# Patient Record
Sex: Female | Born: 1965 | Hispanic: No | Marital: Married | Smoking: Never smoker
Health system: Southern US, Community
[De-identification: ages and names within clinical notes are randomized; demographics above are authoritative.]

---

## 2010-06-08 ENCOUNTER — Other Ambulatory Visit
Admission: RE | Admit: 2010-06-08 | Discharge: 2010-06-08 | Payer: Self-pay | Source: Home / Self Care | Admitting: Family Medicine

## 2020-10-07 ENCOUNTER — Ambulatory Visit (INDEPENDENT_AMBULATORY_CARE_PROVIDER_SITE_OTHER): Payer: 59 | Admitting: Family Medicine

## 2020-10-07 ENCOUNTER — Other Ambulatory Visit: Payer: Self-pay

## 2020-10-07 ENCOUNTER — Other Ambulatory Visit: Payer: Self-pay | Admitting: Family Medicine

## 2020-10-07 ENCOUNTER — Other Ambulatory Visit (HOSPITAL_COMMUNITY)
Admission: RE | Admit: 2020-10-07 | Discharge: 2020-10-07 | Disposition: A | Discharge status: Home / Self Care | Payer: 59 | Source: Ambulatory Visit | Attending: Family Medicine | Admitting: Family Medicine

## 2020-10-07 VITALS — BP 148/119 | HR 85 | Ht 65.0 in | Wt 212.2 lb

## 2020-10-07 DIAGNOSIS — R4589 Other symptoms and signs involving emotional state: Secondary | ICD-10-CM

## 2020-10-07 DIAGNOSIS — I10 Essential (primary) hypertension: Secondary | ICD-10-CM

## 2020-10-07 DIAGNOSIS — Z113 Encounter for screening for infections with a predominantly sexual mode of transmission: Secondary | ICD-10-CM | POA: Diagnosis present

## 2020-10-07 DIAGNOSIS — E669 Obesity, unspecified: Secondary | ICD-10-CM | POA: Diagnosis not present

## 2020-10-07 DIAGNOSIS — Z1231 Encounter for screening mammogram for malignant neoplasm of breast: Secondary | ICD-10-CM

## 2020-10-07 DIAGNOSIS — Z0000 Encounter for general adult medical examination without abnormal findings: Secondary | ICD-10-CM

## 2020-10-07 DIAGNOSIS — Z1159 Encounter for screening for other viral diseases: Secondary | ICD-10-CM | POA: Diagnosis not present

## 2020-10-07 DIAGNOSIS — Z6835 Body mass index (BMI) 35.0-35.9, adult: Secondary | ICD-10-CM | POA: Diagnosis not present

## 2020-10-07 DIAGNOSIS — Z1211 Encounter for screening for malignant neoplasm of colon: Secondary | ICD-10-CM

## 2020-10-07 DIAGNOSIS — Z114 Encounter for screening for human immunodeficiency virus [HIV]: Secondary | ICD-10-CM

## 2020-10-07 DIAGNOSIS — E66812 Obesity, class 2: Secondary | ICD-10-CM

## 2020-10-07 LAB — POCT GLYCOSYLATED HEMOGLOBIN (HGB A1C): Hemoglobin A1C: 5.9 % — AB (ref 4.0–5.6)

## 2020-10-07 MED ORDER — AMLODIPINE BESYLATE 5 MG PO TABS: 5.0000 mg | ORAL_TABLET | Freq: Every day | ORAL | 3 refills | Status: DC

## 2020-10-07 NOTE — Assessment & Plan Note (Addendum)
Patient is behind on health maintenance screening.  Overdue for Pap smear, mammogram, screening colonoscopy.  Given BMI of 35, will also check screening labs.  Hemoglobin A1c elevated at 5.9.  Patient appears motivated to start weight loss journey. -Referral to GI for screening colonoscopy -Follow-up scheduled next month for Pap smear -Screening mammogram ordered -Additional abs: Lipid panel, BMP (would like to start on ACE-I/ARB but creatinine is unknown).  -Continue to encourage lifestyle changes: diet, exercise

## 2020-10-07 NOTE — Patient Instructions (Addendum)
It was wonderful to meet you today.  Today we talked about:  -Today we checked labs to assess your cholesterol panel, Hgb A1c (to screen for diabetes), HIV, Hepatitis B and C, Syphilis, Chlamydia and Gonorrhea  -I ordered for a mamogram  -I started you on amlodipine for high blood pressure, take this once a day. Continue to monitor your blood pressures at home and keep a log. Send me a message with your blood pressures and we can discuss increasing the dose in the next 1-2 weeks. -Below are resources for therapy/counseling  -I placed an order to see a nutritionist. -We will do a Pap smear at your next appointment in June.   Thank you for choosing [facility].   Please call [phone] with any questions about today's appointment.  Please be sure to schedule follow up at the front  desk before you leave today.   Sabino Dick, DO PGY-1 Family Medicine   Outpatient Mental Health Providers (No Insurance required or Self Pay)  [facility]  [phone] [email or web address] [facility]  [address] [place] [address] [place], [place] [phone] Crisis [phone]  [facility]) can see uninsured folks for outpatient therapy [email or web address] [address] [place], [place] [address] [phone]  [facility]    Walk-in Mon-Fri, 8am-3pm [email or web address] Scull [address] [place], [place]  [phone]   [address]  [place] [phone] [facility] for psych med management, there may be a wait- if [facility] is working with clients for OPT, they will coordinate with [facility] for psych  [facility]   Walk-in-Clinic: Monday- Friday 9:00 AM - 4:00 PM [address]   [place], [place] [phone]  [facility] [place] ([employer]) walk in M-F 8am-12pm and  1pm-3pm [place]- [address]     [phone]  [employer] -[address]  Phone: [phone]  [employer] (Mental Health and substance challenges) [address]   [place] [phone]    [email or web address]    [facility]  [place] -[address], [place]     Phone:  [phone] [place]-  [address]  [phone]   [facility]  [address] [place]  [phone] [email or web address]   [facility] ( virtual or zoom therapy) [email or web address]  [address]  [phone]    [facility] [phone]  grief counseling, dementia and caregiver support    [facility] Walk-in MWF 12:30 to 3:00     [address] [place] [address]  [phone]  [email or web address] call to schedule an appointment    Mental Health [facility] Classes ,Support group, Peer support services, [address] [place], [place] [address] [phone]  [email or web address]           National Alliance on Mental Illness (NAMI) [place]- Wellness classes, Support groups        [address], [place], [place] [address] [phone]   [email or web address]   [facility]  (Psycho-social Rehabilitation clubhouse, Individual and group therapy) [address] [place], [place] [address]   [phone]  24- Hour Availability:  [facility] [phone] or [phone] * [facility] [employer] (Domestic Violence, Rape, etc. )2361455334 Vesta Mixer [phone] or [phone] * [facility] [place] [facility]  Services [phone] only) [phone] (after hours) *[facility] Mobile Crisis Unit [phone] *[place] National Suicide Hotline [phone] Len Childs)

## 2020-10-07 NOTE — Progress Notes (Signed)
SUBJECTIVE:   CHIEF COMPLAINT / HPI:   Establish Care Patient presents today to establish care. States that it has been about 4 years since she has seen a physician.  She would like to get caught up on her health maintenance screening and would like screening blood work today.  Her last Pap smear and mammogram were approximately 4 years ago.  Reports no prior abnormalities in her Paps or mammograms.  Elevated Blood Pressures  Also notes that she has had elevated blood pressures at home.  Systolics usually in the 150s with diastolics usually in the 110s.  She is not on any medication for this.  She does note recent stress (more information below).   Depressed Mood Patient states that she believes she is depressed.  Tells me that she is undergoing a lot of stress in her marriage.  She has been married for approximately 30 years but within the last few years discovered that her husband has been unfaithful and has told any lies.  This is because not only her stress but her youngest son, since he is the one who discovered he was being unfaithful.  She feels that because of this increased stress, she has put off caring for herself.  She is finally ready and willing to start on her wellness journey.  She requests resources for therapy and counseling.  No SI.  She feels safe at home.  She does note that her husband has firearms because he hunts, but he has never physically abused to protect her and she generally feels safe though "you never know".  She really feels that she would benefit from having someone to vent/talk to as she navigates through this process.  Obesity Patient notes that she is overweight and would like assistance with nutritional management.  She is also wondering if she should get a Systems analyst for some additional help.  She would like referral to nutritionist for further education and recommendations.  PERTINENT  PMH / PSH: No past medical history on file.  OBJECTIVE:   BP  (!) 148/119 Comment: Right arm  Pulse 85   Ht 5\' 5"  (1.651 m)   Wt 212 lb 4 oz (96.3 kg)   SpO2 98%   BMI 35.32 kg/m    General: Mild emotional distress but not tearful Cardiac: RRR, no murmurs. Respiratory: CTAB, normal effort, No wheezes, rales or rhonchi Extremities: mild 1+ b/l ankle edema Skin: warm and dry, no rashes noted Psych: Appropriate affect   Depression screen PHQ 2/9 10/07/2020  Decreased Interest 1  Down, Depressed, Hopeless 1  PHQ - 2 Score 2  Altered sleeping 2  Tired, decreased energy 2  Change in appetite 2  Feeling bad or failure about yourself  2  Trouble concentrating 3  Moving slowly or fidgety/restless 3  Suicidal thoughts 0  PHQ-9 Score 16  Difficult doing work/chores Somewhat difficult     ASSESSMENT/PLAN:   Healthcare maintenance Patient is behind on health maintenance screening.  Overdue for Pap smear, mammogram, screening colonoscopy.  Given BMI of 35, will also check screening labs.  Hemoglobin A1c elevated at 5.9.  Patient appears motivated to start weight loss journey. -Referral to GI for screening colonoscopy -Follow-up scheduled next month for Pap smear -Screening mammogram ordered -Additional abs: Lipid panel, BMP (would like to start on ACE-I/ARB but creatinine is unknown).  -Continue to encourage lifestyle changes: diet, exercise    Hypertension Patient initially hypertensive upon arrival to 180s systolic over 110s diastolic.  Upon  recheck she improved to 148/119.  Patient states that she monitors her blood pressures at home and they range 150 systolic and 110s diastolic typically.  Given persistently elevated blood pressures, will begin treatment.  Her creatinine is unknown.  At this time we will opt to start with amlodipine even though she has mild edema isolated to her ankles.  She was counseled that this medication could worsen her edema, she is to let me know if this happens.  She will monitor her blood pressures for the next couple  weeks and we could consider additional medication after her labs result/at next follow up. -Start Amlodipine 5mg   -Consider close 1-2 week f/u with Pharmacy team   Depressed mood Patient is undergoing significant stress, mostly related to her marriage.  She is nonsuicidal.  Her PHQ 9 was elevated at 16.  I do feel that she would benefit greatly from therapy/counseling.  She opted to not pursue medication at this time. -Resources for therapy/counseling provided to patient -Mood check and follow-up -Consider addition of antidepressants at follow-up   Obese BMI 35.  Patient is interested in additional resources for nutritional counseling.  She is prediabetic and would benefit greatly from weight loss and exercise. -Referral to nutrition placed today, patient was given number to call Dr. for an appointment -Continue to encourage dietary modifications and exercise     Gerilyn Pilgrim, DO [facility]

## 2020-10-07 NOTE — Assessment & Plan Note (Signed)
Patient is undergoing significant stress, mostly related to her marriage.  She is nonsuicidal.  Her PHQ 9 was elevated at 16.  I do feel that she would benefit greatly from therapy/counseling.  She opted to not pursue medication at this time. -Resources for therapy/counseling provided to patient -Mood check and follow-up -Consider addition of antidepressants at follow-up

## 2020-10-07 NOTE — Assessment & Plan Note (Signed)
BMI 35.  Patient is interested in additional resources for nutritional counseling.  She is prediabetic and would benefit greatly from weight loss and exercise. -Referral to nutrition placed today, patient was given number to call Dr. Gerilyn Pilgrim for an appointment -Continue to encourage dietary modifications and exercise

## 2020-10-07 NOTE — Assessment & Plan Note (Addendum)
Patient initially hypertensive upon arrival to 180s systolic over 110s diastolic.  Upon recheck she improved to 148/119.  Patient states that she monitors her blood pressures at home and they range 150 systolic and 110s diastolic typically.  Given persistently elevated blood pressures, will begin treatment.  Her creatinine is unknown.  At this time we will opt to start with amlodipine even though she has mild edema isolated to her ankles.  She was counseled that this medication could worsen her edema, she is to let me know if this happens.  She will monitor her blood pressures for the next couple weeks and we could consider additional medication after her labs result/at next follow up. -Start Amlodipine 5mg   -Consider close 1-2 week f/u with Pharmacy team

## 2020-10-08 LAB — BASIC METABOLIC PANEL
BUN/Creatinine Ratio: 19 (ref 9–23)
BUN: 16 mg/dL (ref 6–24)
CO2: 24 mmol/L (ref 20–29)
Calcium: 9.3 mg/dL (ref 8.7–10.2)
Chloride: 99 mmol/L (ref 96–106)
Creatinine, Ser: 0.84 mg/dL (ref 0.57–1.00)
Glucose: 84 mg/dL (ref 65–99)
Potassium: 3.7 mmol/L (ref 3.5–5.2)
Sodium: 141 mmol/L (ref 134–144)
eGFR: 83 mL/min/{1.73_m2} (ref >59)

## 2020-10-08 LAB — LIPID PANEL
Chol/HDL Ratio: 4.7 ratio — ABNORMAL HIGH (ref 0.0–4.4)
Cholesterol, Total: 234 mg/dL — ABNORMAL HIGH (ref 100–199)
HDL: 50 mg/dL (ref >39)
LDL Chol Calc (NIH): 168 mg/dL — ABNORMAL HIGH (ref 0–99)
Triglycerides: 89 mg/dL (ref 0–149)
VLDL Cholesterol Cal: 16 mg/dL (ref 5–40)

## 2020-10-08 LAB — HEPATITIS B SURFACE ANTIBODY, QUANTITATIVE: Hepatitis B Surf Ab Quant: 88.7 m[IU]/mL (ref >9.9)

## 2020-10-08 LAB — RPR: RPR Ser Ql: NONREACTIVE

## 2020-10-08 LAB — HEPATITIS C ANTIBODY: Hep C Virus Ab: <0.1 s/co ratio (ref 0.0–0.9)

## 2020-10-08 LAB — HIV ANTIBODY (ROUTINE TESTING W REFLEX): HIV Screen 4th Generation wRfx: NONREACTIVE

## 2020-10-11 LAB — URINE CYTOLOGY ANCILLARY ONLY
Chlamydia: NEGATIVE
Comment: NEGATIVE
Comment: NORMAL
Neisseria Gonorrhea: NEGATIVE

## 2020-10-12 ENCOUNTER — Other Ambulatory Visit: Payer: Self-pay | Admitting: Family Medicine

## 2020-10-12 DIAGNOSIS — I10 Essential (primary) hypertension: Secondary | ICD-10-CM

## 2020-10-12 DIAGNOSIS — E785 Hyperlipidemia, unspecified: Secondary | ICD-10-CM

## 2020-10-12 DIAGNOSIS — E1169 Type 2 diabetes mellitus with other specified complication: Secondary | ICD-10-CM

## 2020-10-12 MED ORDER — ATORVASTATIN CALCIUM 20 MG PO TABS
20.0000 mg | ORAL_TABLET | Freq: Every day | ORAL | 3 refills | Status: DC
Start: 2020-10-12 — End: 2021-11-09

## 2020-10-12 MED ORDER — LOSARTAN POTASSIUM 25 MG PO TABS
25.0000 mg | ORAL_TABLET | Freq: Every day | ORAL | 0 refills | Status: DC
Start: 2020-10-12 — End: 2021-01-10

## 2020-10-12 NOTE — Progress Notes (Signed)
Discussed blood results with patient over the phone using two patient identifiers.  Discussed elevated lipid panel and pre-diabetes.  ASCVD risk increased at 9.8%.  She is amenable to starting atorvastatin 20 mg daily for this.  We further discussed lifestyle modifications including diet and exercise.  She mentioned to me that her blood pressures remain elevated even with starting amlodipine.  Given that her creatinine is normal, will add losartan for more blood pressure control.  Advised patient to call the front office for assistance with scheduling a blood pressure follow-up with pharmacy next week (I attempted to do this but was unsuccessful).

## 2020-11-04 ENCOUNTER — Ambulatory Visit (INDEPENDENT_AMBULATORY_CARE_PROVIDER_SITE_OTHER): Payer: 59 | Admitting: Family Medicine

## 2020-11-04 ENCOUNTER — Other Ambulatory Visit: Payer: Self-pay

## 2020-11-04 VITALS — BP 138/92 | HR 100 | Temp 98.2°F | Wt 212.0 lb

## 2020-11-04 DIAGNOSIS — E669 Obesity, unspecified: Secondary | ICD-10-CM | POA: Diagnosis not present

## 2020-11-04 DIAGNOSIS — I10 Essential (primary) hypertension: Secondary | ICD-10-CM | POA: Diagnosis not present

## 2020-11-04 DIAGNOSIS — R4589 Other symptoms and signs involving emotional state: Secondary | ICD-10-CM | POA: Diagnosis not present

## 2020-11-04 DIAGNOSIS — Z6835 Body mass index (BMI) 35.0-35.9, adult: Secondary | ICD-10-CM

## 2020-11-04 MED ORDER — AMLODIPINE BESYLATE 10 MG PO TABS: 10.0000 mg | ORAL_TABLET | Freq: Every day | ORAL | 3 refills | Status: DC

## 2020-11-04 MED ORDER — SERTRALINE HCL 50 MG PO TABS: 50.0000 mg | ORAL_TABLET | Freq: Every day | ORAL | 0 refills | Status: DC

## 2020-11-04 NOTE — Progress Notes (Signed)
SUBJECTIVE:   CHIEF COMPLAINT / HPI:   Hypertension Patient presents today for follow up on her blood pressures. She brings a log with her today. Log shows BP's consistently in 130-140 systolic with generally 90's diastolic. She is currently taking 5 mg Amlodipine and 25 mg Losartan. Tolerating these medications well. States that when she went in for her recent cataract surgery she was told her blood pressure was really high, believes 100's diastolic. She is working on weight loss, would like contact information for Dr. Gerilyn Pilgrim again.  Depressed Mood Patient also following up for depressed mood. States that she has not yet scheduled an appointment for therapy/counseling due to her eye surgeries. Now that she is healing and able to see better, she will look into scheduling appointment. She would like to have someone to talk to regarding her stressors and past traumas. Most of her depressed mood and stress are a result of her marriage. She is working on figuring out financial matters prior to proceeding with divorce. She denies suicidal ideations or thoughts of self-harm.   Urinary Urgency Briefly she discussed with me about problems with urinary urgency. States that when she has to go to the bathroom she has to go immediately, unable to hold her urine in.   Additional concerns patient had that were unable to be discussed further today: -Pain in fingers. States typically in the mornings and feels arthritic.   PERTINENT  PMH / PSH: No past medical history on file.   OBJECTIVE:   BP (!) 138/92   Pulse 100   Temp 98.2 F (36.8 C) (Oral)   Wt 212 lb (96.2 kg)   SpO2 97%   BMI 35.28 kg/m    General: NAD, wearing sunglasses, anxious appearing, able to participate in exam Cardiac: RRR, no murmurs. Respiratory: CTAB, normal effort, No wheezes, rales or rhonchi Neuro: speaking clearly, answering questions appropriately, able to follow commands  Psych: Anxious and depressed affect    Depression screen [facility] 2/9 11/04/2020 11/04/2020 10/07/2020  Decreased Interest 2 2 1   Down, Depressed, Hopeless 2 2 1   PHQ - 2 Score 4 4 2   Altered sleeping 2 2 2   Tired, decreased energy 2 2 2   Change in appetite 2 2 2   Feeling bad or failure about yourself  2 2 2   Trouble concentrating 2 2 3   Moving slowly or fidgety/restless 2 2 3   Suicidal thoughts 0 2 0  PHQ-9 Score 16 18 16   Difficult doing work/chores Somewhat difficult Very difficult Somewhat difficult     ASSESSMENT/PLAN:   Hypertension Still hypertensive today, 138/92. Reviewed patients BP log which also demonstrates consistent hypertension even at home. -Increase Amlodipine to 10 mg daily  -Continue Losartan 25 mg daily -BMP today  Obese Still interested in meeting with Dr. for nutritional help for weight loss. Patient appears motivated for weight loss. Congratulated her efforts on trying to lose weight.  -Dr. contact information provided to patient again today.  Depressed mood PHQ-9 today similar to previous. At first patient had responded with a "2" to question 9 about SI. When I discussed this with her she stated that was done in error and she denied any thoughts of wanting to harm or kill herself. She still has the resources provided to her at the last appointment for therapy/counseling. I encouraged her to go as I feel she would benefit greatly. Additionally, we discussed starting medications to help with her depression as well. She was interested. Advised on common  side effects of SSRI's. She has no history of manic symptoms or personal or family history of bipolar disorder. -Start Sertraline 50 mg daily  -Patient to schedule appointment for therapy/counseling  -Mood check in 1 week at follow up   Will need at Follow-Up -Pap Smear -offer Differin gel for areas of hyperpigmentation on face -BP recheck  -Mood check  Sabino Dick, DO [facility]

## 2020-11-04 NOTE — Assessment & Plan Note (Signed)
Still interested in meeting with Dr. Gerilyn Pilgrim for nutritional help for weight loss. Patient appears motivated for weight loss. Congratulated her efforts on trying to lose weight.  -Dr. Gerilyn Pilgrim contact information provided to patient again today.

## 2020-11-04 NOTE — Assessment & Plan Note (Signed)
Still hypertensive today, 138/92. Reviewed patients BP log which also demonstrates consistent hypertension even at home. -Increase Amlodipine to 10 mg daily  -Continue Losartan 25 mg daily -BMP today

## 2020-11-04 NOTE — Assessment & Plan Note (Signed)
PHQ-9 today similar to previous. At first patient had responded with a "2" to question 9 about SI. When I discussed this with her she stated that was done in error and she denied any thoughts of wanting to harm or kill herself. She still has the resources provided to her at the last appointment for therapy/counseling. I encouraged her to go as I feel she would benefit greatly. Additionally, we discussed starting medications to help with her depression as well. She was interested. Advised on common side effects of SSRI's. She has no history of manic symptoms or personal or family history of bipolar disorder. -Start Sertraline 50 mg daily  -Patient to schedule appointment for therapy/counseling  -Mood check in 1 week at follow up

## 2020-11-04 NOTE — Patient Instructions (Addendum)
It was wonderful to see you today.  Please bring ALL of your medications with you to every visit.   Today we talked about:  I am starting you a medication called Sertraline for depression.   Increase your Amlodipine 10 mg once a day.  We are checking some blood work today to check on your electrolyte and kidney function.   You have an appointment with me on 6/10 at 945 for a Pap smear.  You can do kegel exercises below to help strengthen your pelvic muscles, this will hopefully help with your urinary urgency.  Thank you for choosing [facility].   Please call [phone] with any questions about today's appointment.  Please be sure to schedule follow up at the front  desk before you leave today.   Sabino Dick, DO PGY-1 Family Medicine         Kegel Exercises  Kegel exercises can help strengthen your pelvic floor muscles. The pelvic floor is a group of muscles that support your rectum, small intestine, and bladder. In females, pelvic floor muscles also help support the womb (uterus). These muscles help you control the flow of urine and stool. Kegel exercises are painless and simple, and they do not require any equipment. Your provider may suggest Kegel exercises to:  Improve bladder and bowel control.  Improve sexual response.  Improve weak pelvic floor muscles after surgery to remove the uterus (hysterectomy) or pregnancy (females).  Improve weak pelvic floor muscles after prostate gland removal or surgery (males). Kegel exercises involve squeezing your pelvic floor muscles, which are the same muscles you squeeze when you try to stop the flow of urine or keep from passing gas. The exercises can be done while sitting, standing, or lying down, but it is best to vary your position. Exercises How to do Kegel exercises: 1. Squeeze your pelvic floor muscles tight. You should feel a tight lift in your rectal area. If you are a female, you should also  feel a tightness in your vaginal area. Keep your stomach, buttocks, and legs relaxed. 2. Hold the muscles tight for up to 10 seconds. 3. Breathe normally. 4. Relax your muscles. 5. Repeat as told by your health care provider. Repeat this exercise daily as told by your health care provider. Continue to do this exercise for at least 4-6 weeks, or for as long as told by your health care provider. You may be referred to a physical therapist who can help you learn more about how to do Kegel exercises. Depending on your condition, your health care provider may recommend:  Varying how long you squeeze your muscles.  Doing several sets of exercises every day.  Doing exercises for several weeks.  Making Kegel exercises a part of your regular exercise routine. This information is not intended to replace advice given to you by your health care provider. Make sure you discuss any questions you have with your health care provider. Document Revised: 09/26/2019 Document Reviewed: 01/09/2018 Elsevier Patient Education  2021 [employer].

## 2020-11-05 LAB — BASIC METABOLIC PANEL
BUN/Creatinine Ratio: 18 (ref 9–23)
BUN: 14 mg/dL (ref 6–24)
CO2: 25 mmol/L (ref 20–29)
Calcium: 9.4 mg/dL (ref 8.7–10.2)
Chloride: 100 mmol/L (ref 96–106)
Creatinine, Ser: 0.78 mg/dL (ref 0.57–1.00)
Glucose: 91 mg/dL (ref 65–99)
Potassium: 4.2 mmol/L (ref 3.5–5.2)
Sodium: 139 mmol/L (ref 134–144)
eGFR: 91 mL/min/{1.73_m2} (ref >59)

## 2020-11-08 ENCOUNTER — Encounter: Payer: Self-pay | Admitting: Family Medicine

## 2020-11-11 NOTE — Progress Notes (Addendum)
SUBJECTIVE:   CHIEF COMPLAINT / HPI:   Depression F/U Started on Sertraline 50 mg at last visit. Taking daily and tolerating well without side effects. Feels that her mood is improved and she is sleeping better. She still has not called to schedule an appointment with a therapist as she has been busy with follow ups from her recent eye surgery. She hopes to do this within the next week. Tolerating current dose well, prefers to stay at current dose.    Encounter for Pap smear Needs Pap smear for cervical cancer screening. Also would like screening for STI's. No abnormal vaginal discharge.   Hypertension F/U BP today 122/82. On Losartan 25 mg and Amlodipine was increased from 5 mg to 10 mg at recent visit. BMP last visit which was normal. Patient has been taking medication daily without adverse effects. Also working on exercising more. Long-term goal is for weight loss and to come off medications.   Contact Dermatitis Started after doing yard work 1 week ago.  On both legs, feels that it is spreading.  She has been using over-the-counter poison ivy cream.  States that in the past when she has had this happen she has required antibiotics.  PERTINENT  PMH / PSH: HTN, Depression   OBJECTIVE:   BP 122/82   Pulse 64   Ht 5\' 5"  (1.651 m)   Wt 210 lb (95.3 kg)   LMP 11/13/2015   SpO2 98%   BMI 34.95 kg/m    General: NAD, pleasant, able to participate in exam Respiratory: Breathing comfortably on room air no respiratory distress Extremities: no edema or cyanosis. Skin: erythematous, raised linear areas of dermatitis on b/l legs (pictures below) GU: Normal appearing external female genitalia, normal vaginal epithelium, scant clear discharge at vaginal vault. Normal appearing cervix.  Psych: Normal affect and mood  GU Exam chaperoned by 01/13/2016, CMA     Depression screen [facility] 2/9 11/12/2020 11/04/2020 11/04/2020  Decreased Interest 2 2 2   Down, Depressed, Hopeless 1 2 2   PHQ  - 2 Score 3 4 4   Altered sleeping 1 2 2   Tired, decreased energy 2 2 2   Change in appetite 1 2 2   Feeling bad or failure about yourself  1 2 2   Trouble concentrating 2 2 2   Moving slowly or fidgety/restless 1 2 2   Suicidal thoughts 0 0 2  PHQ-9 Score 11 16 18   Difficult doing work/chores - Somewhat difficult Very difficult     ASSESSMENT/PLAN:   Pap smear for cervical cancer screening Pap smear with HPV cotesting performed today.  GU exam without any abnormalities. -Follow-up results; if normal next screening in 5 years  Routine screening for STI (sexually transmitted infection) Patient elected to undergo screening for sexually transmitted infections. -Follow-up GC/chlamydia, trichomonas -Treat as appropriate  Hypertension Blood pressure is much improved today.  Patient is on losartan 25 mg and amlodipine 10 mg.  She is tolerating both medications well without any adverse effects.  She is working towards weight loss and exercise.  Anticipate being able to decrease her medications in the future as she loses weight. -Continue current medication regimen  Depressed mood Taking sertraline 50 mg daily without adverse effects.  Reports her mood has improved and she is also sleeping better.  PHQ-9 slightly improved from 16 to 11. patient requests to continue at current dose at this time.  There is room to increase in the future if necessary.  No SI.  Patient will call to arrange  therapy/counseling.  Contact dermatitis To both legs.  Has been using over-the-counter poison ivy cream without relief.  On exam, areas are linear and consistent with poison ivy.  Do not feel that antibiotics are necessary at this time.  Will give prescription for topical steroids. -Triamcinolone 0.1% cream twice daily to affected areas -Return if worsens or no improvement -Advised patient to avoid contact with the area as possible    Sabino Dick, DO [facility]

## 2020-11-12 ENCOUNTER — Other Ambulatory Visit: Payer: Self-pay | Admitting: Family Medicine

## 2020-11-12 ENCOUNTER — Ambulatory Visit (INDEPENDENT_AMBULATORY_CARE_PROVIDER_SITE_OTHER): Payer: 59 | Admitting: Family Medicine

## 2020-11-12 ENCOUNTER — Other Ambulatory Visit (HOSPITAL_COMMUNITY)
Admission: RE | Admit: 2020-11-12 | Discharge: 2020-11-12 | Disposition: A | Discharge status: Home / Self Care | Payer: 59 | Source: Ambulatory Visit | Attending: Family Medicine | Admitting: Family Medicine

## 2020-11-12 ENCOUNTER — Other Ambulatory Visit: Payer: Self-pay

## 2020-11-12 ENCOUNTER — Encounter: Payer: Self-pay | Admitting: Family Medicine

## 2020-11-12 VITALS — BP 122/82 | HR 64 | Ht 65.0 in | Wt 210.0 lb

## 2020-11-12 DIAGNOSIS — Z113 Encounter for screening for infections with a predominantly sexual mode of transmission: Secondary | ICD-10-CM | POA: Diagnosis not present

## 2020-11-12 DIAGNOSIS — I10 Essential (primary) hypertension: Secondary | ICD-10-CM

## 2020-11-12 DIAGNOSIS — L259 Unspecified contact dermatitis, unspecified cause: Secondary | ICD-10-CM | POA: Insufficient documentation

## 2020-11-12 DIAGNOSIS — L255 Unspecified contact dermatitis due to plants, except food: Secondary | ICD-10-CM

## 2020-11-12 DIAGNOSIS — L237 Allergic contact dermatitis due to plants, except food: Secondary | ICD-10-CM

## 2020-11-12 DIAGNOSIS — Z124 Encounter for screening for malignant neoplasm of cervix: Secondary | ICD-10-CM

## 2020-11-12 DIAGNOSIS — R4589 Other symptoms and signs involving emotional state: Secondary | ICD-10-CM

## 2020-11-12 MED ORDER — TRIAMCINOLONE ACETONIDE 0.1 % EX CREA: 1.0000 "application " | TOPICAL_CREAM | Freq: Two times a day (BID) | CUTANEOUS | 0 refills | Status: DC

## 2020-11-12 MED ORDER — SERTRALINE HCL 50 MG PO TABS: 50.0000 mg | ORAL_TABLET | Freq: Every day | ORAL | 3 refills | Status: AC

## 2020-11-12 NOTE — Assessment & Plan Note (Signed)
Patient elected to undergo screening for sexually transmitted infections. -Follow-up GC/chlamydia, trichomonas -Treat as appropriate

## 2020-11-12 NOTE — Assessment & Plan Note (Addendum)
Taking sertraline 50 mg daily without adverse effects.  Reports her mood has improved and she is also sleeping better.  PHQ-9 slightly improved from 16 to 11. patient requests to continue at current dose at this time.  There is room to increase in the future if necessary.  No SI.  Patient will call to arrange therapy/counseling.

## 2020-11-12 NOTE — Assessment & Plan Note (Signed)
To both legs.  Has been using over-the-counter poison ivy cream without relief.  On exam, areas are linear and consistent with poison ivy.  Do not feel that antibiotics are necessary at this time.  Will give prescription for topical steroids. -Triamcinolone 0.1% cream twice daily to affected areas -Return if worsens or no improvement -Advised patient to avoid contact with the area as possible

## 2020-11-12 NOTE — Assessment & Plan Note (Signed)
Pap smear with HPV cotesting performed today.  GU exam without any abnormalities. -Follow-up results; if normal next screening in 5 years

## 2020-11-12 NOTE — Assessment & Plan Note (Addendum)
Blood pressure is much improved today.  Patient is on losartan 25 mg and amlodipine 10 mg.  She is tolerating both medications well without any adverse effects.  She is working towards weight loss and exercise.  Anticipate being able to decrease her medications in the future as she loses weight. -Continue current medication regimen

## 2020-11-12 NOTE — Patient Instructions (Signed)
It was wonderful to see you today.  Please bring ALL of your medications with you to every visit.   Today we talked about:  Your blood pressure. It is much better! Continue to take your medications daily. With time as you work towards weight loss and more exercise we may be able to stop some of these medications. Try to also limit the salt in your diet.  I'm glad that you are feeling better and sleeping better. Continue to take Sertraline daily for your depression. We will stay at the current dose.  We did a Pap smear today and screening for sexually transmitted infections. I will let you know the results of this. If all is normal, your next screen would be in 5 years.   I have prescribed a steroid cream for you to use for your poison ivy. Use it twice a day. It should start to clear up. Try to avoid scratching or touching the area as it will spread.  Thank you for choosing [facility].   Please call [phone] with any questions about today's appointment.   Sabino Dick, DO PGY-1 Family Medicine

## 2020-11-15 LAB — CYTOLOGY - PAP
Adequacy: ABSENT
Comment: NEGATIVE
Diagnosis: NEGATIVE
High risk HPV: NEGATIVE

## 2020-11-15 LAB — CERVICOVAGINAL ANCILLARY ONLY
Chlamydia: NEGATIVE
Comment: NEGATIVE
Comment: NEGATIVE
Comment: NORMAL
Neisseria Gonorrhea: NEGATIVE
Trichomonas: NEGATIVE

## 2020-11-16 ENCOUNTER — Other Ambulatory Visit: Payer: Self-pay | Admitting: Family Medicine

## 2020-11-16 DIAGNOSIS — L255 Unspecified contact dermatitis due to plants, except food: Secondary | ICD-10-CM

## 2020-11-16 MED ORDER — TRIAMCINOLONE ACETONIDE 0.5 % EX OINT: 1.0000 "application " | TOPICAL_OINTMENT | Freq: Two times a day (BID) | CUTANEOUS | 0 refills | Status: AC

## 2020-12-01 ENCOUNTER — Ambulatory Visit
Admission: RE | Admit: 2020-12-01 | Discharge: 2020-12-01 | Disposition: A | Discharge status: Home / Self Care | Payer: 59 | Source: Ambulatory Visit | Attending: Family Medicine | Admitting: Family Medicine

## 2020-12-01 ENCOUNTER — Other Ambulatory Visit: Payer: Self-pay

## 2020-12-01 DIAGNOSIS — Z1231 Encounter for screening mammogram for malignant neoplasm of breast: Secondary | ICD-10-CM

## 2021-01-08 ENCOUNTER — Other Ambulatory Visit: Payer: Self-pay | Admitting: Family Medicine

## 2021-01-08 DIAGNOSIS — I10 Essential (primary) hypertension: Secondary | ICD-10-CM

## 2021-04-01 ENCOUNTER — Other Ambulatory Visit: Payer: Self-pay | Admitting: Family Medicine

## 2021-04-01 DIAGNOSIS — I10 Essential (primary) hypertension: Secondary | ICD-10-CM

## 2021-06-30 ENCOUNTER — Other Ambulatory Visit: Payer: Self-pay | Admitting: Family Medicine

## 2021-06-30 DIAGNOSIS — I10 Essential (primary) hypertension: Secondary | ICD-10-CM

## 2021-11-08 ENCOUNTER — Encounter: Payer: Self-pay | Admitting: *Deleted

## 2021-11-09 ENCOUNTER — Other Ambulatory Visit: Payer: Self-pay | Admitting: Family Medicine

## 2021-11-09 DIAGNOSIS — E1169 Type 2 diabetes mellitus with other specified complication: Secondary | ICD-10-CM

## 2022-04-30 IMAGING — MG MM DIGITAL SCREENING BILAT W/ TOMO AND CAD
6 of 10 series · 6 of 30 positions shown · non-contrast
Comparison: None.

CLINICAL DATA: Screening.

EXAM:
DIGITAL SCREENING BILATERAL MAMMOGRAM WITH TOMOSYNTHESIS AND CAD
TECHNIQUE: Bilateral screening digital craniocaudal and mediolateral oblique
mammograms were obtained. Bilateral screening digital breast
tomosynthesis was performed. The images were evaluated with
computer-aided detection.

[L MLO synth-2D (1 of 2)]
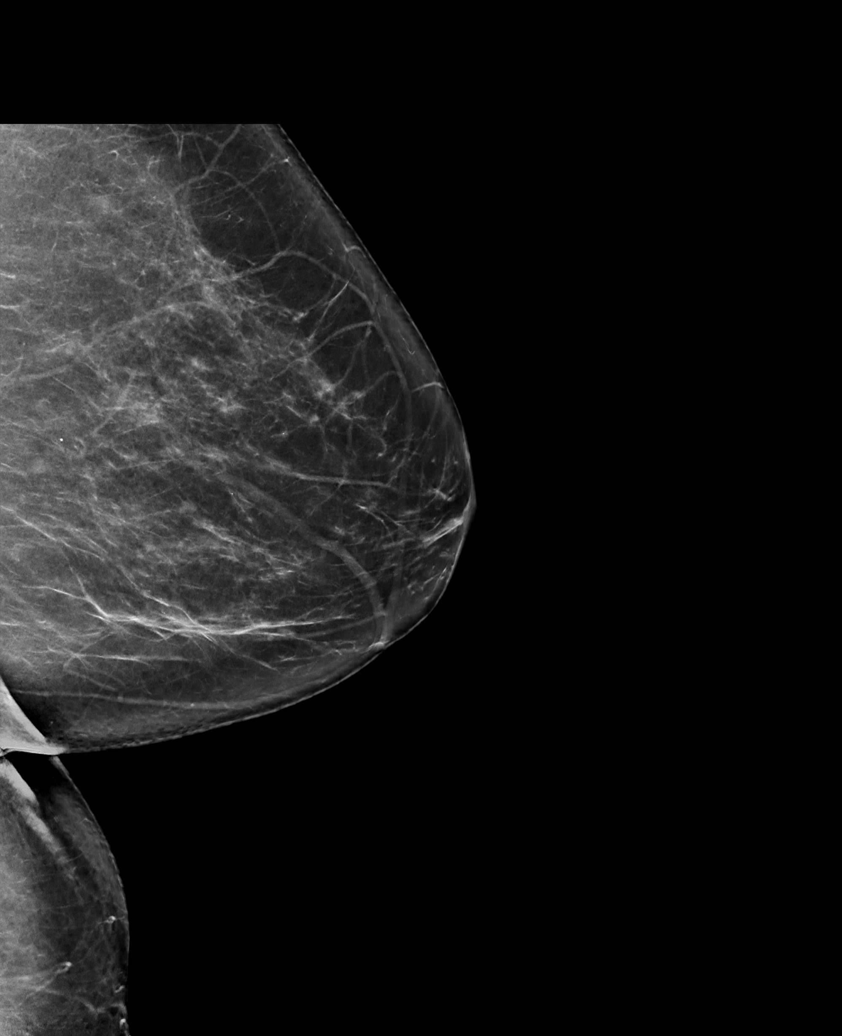

[R MLO synth-2D]
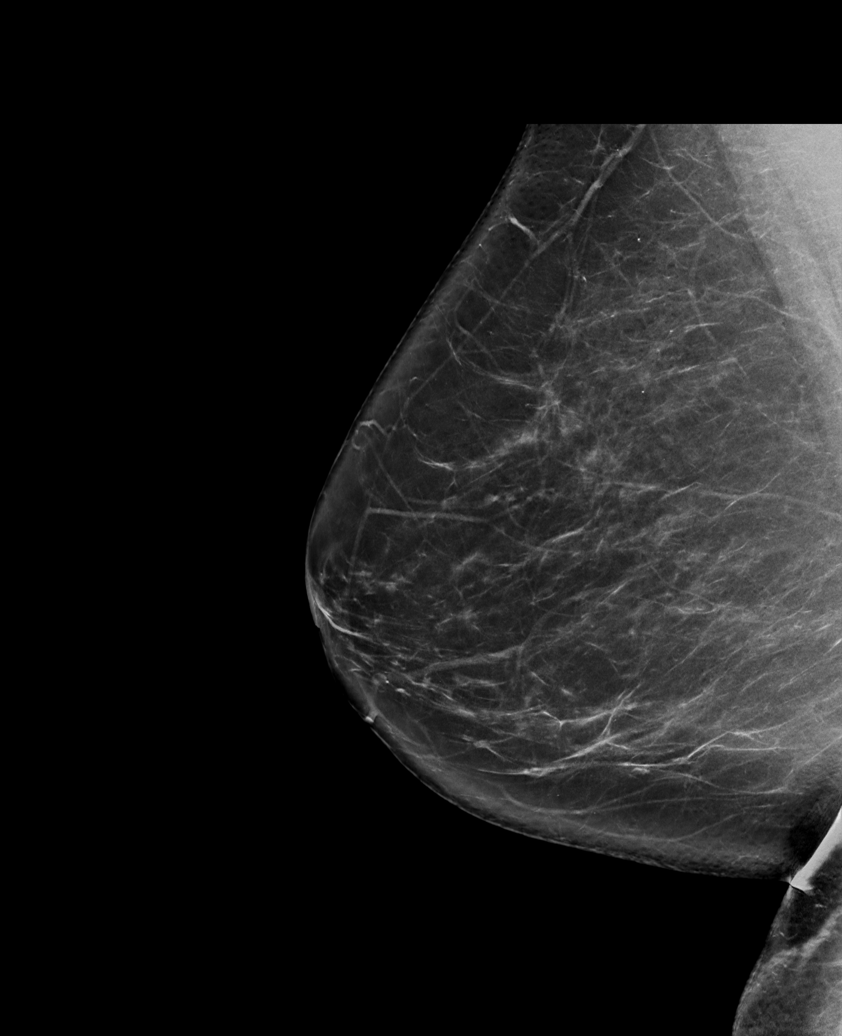

[R CC synth-2D]
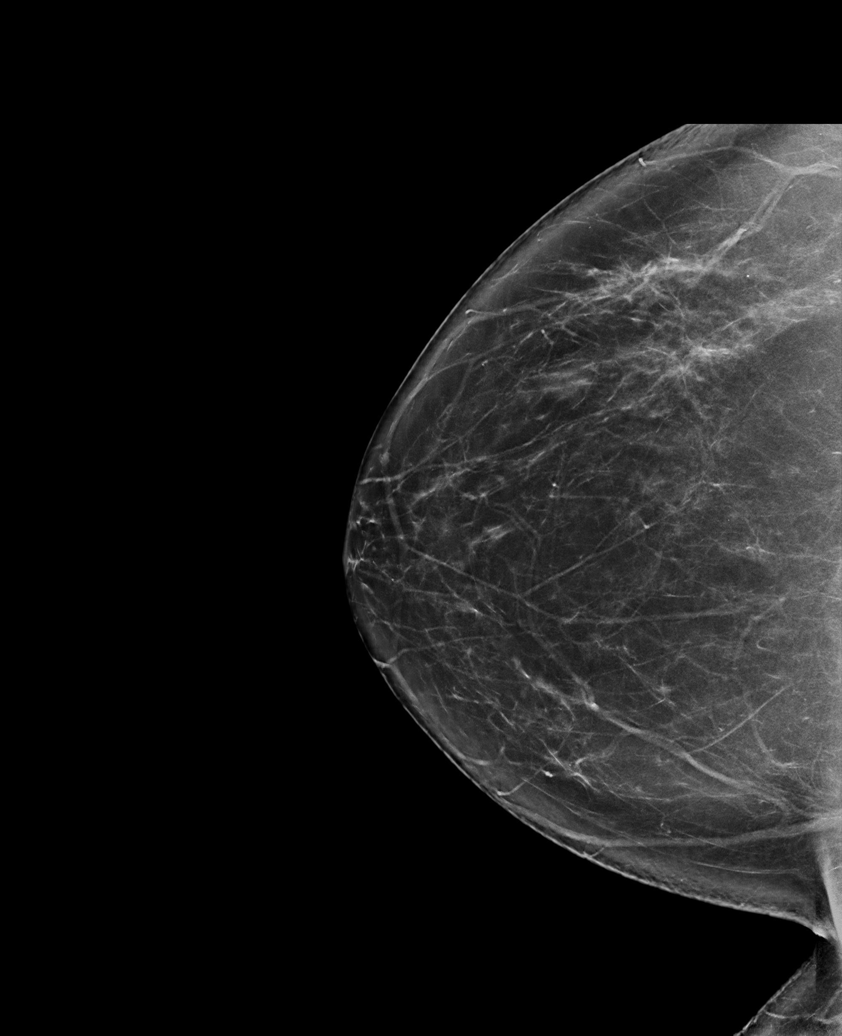

[L CC synth-2D]
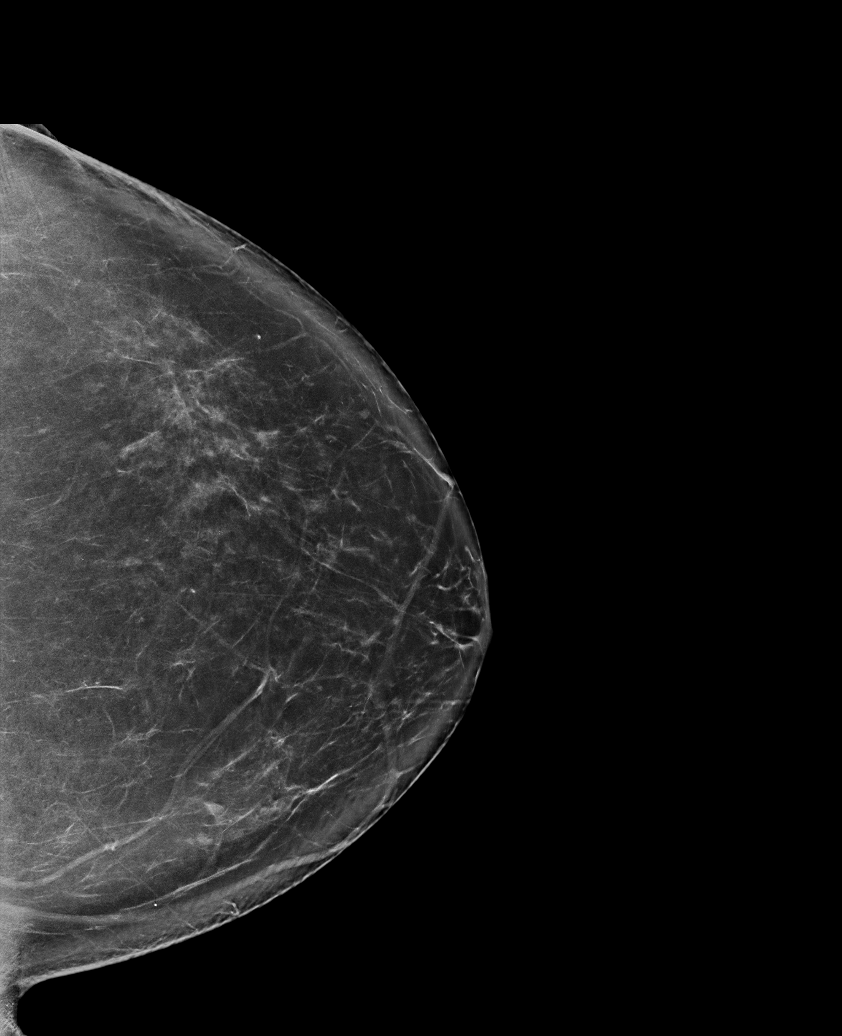

[L MLO synth-2D (2 of 2)]
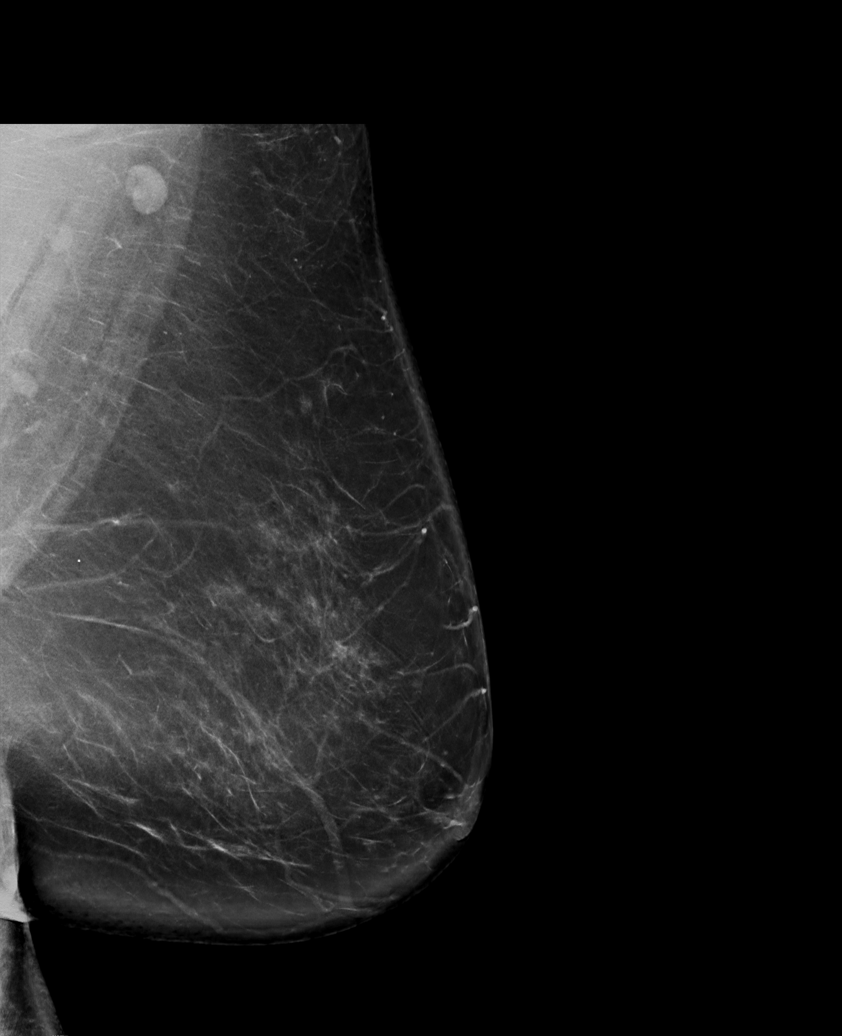

[R MLO tomo · tomo slice 45/88.0]
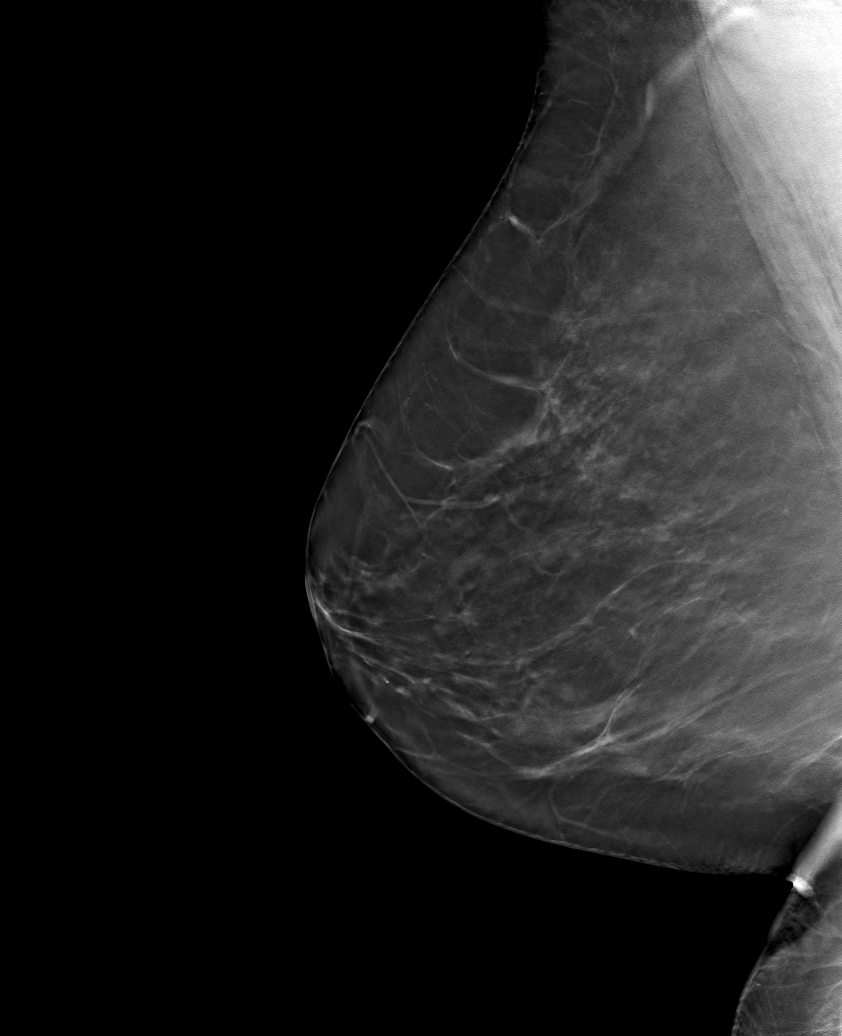

[6 of 30 positions shown; findings below may reference images not displayed]

ACR Breast Density Category b: There are scattered areas of
fibroglandular density.
FINDINGS: There are no findings suspicious for malignancy.
IMPRESSION: No mammographic evidence of malignancy. A result letter of this
screening mammogram will be mailed directly to the patient.

RECOMMENDATION:
Screening mammogram in one year. (Code:XG-X-X7B)

BI-RADS CATEGORY  1: Negative.

## 2022-08-06 ENCOUNTER — Other Ambulatory Visit: Payer: Self-pay | Admitting: Family Medicine

## 2022-08-06 DIAGNOSIS — I10 Essential (primary) hypertension: Secondary | ICD-10-CM
